# Patient Record
Sex: Female | Born: 1990 | Smoking: Never smoker
Health system: Southern US, Community
[De-identification: ages and names within clinical notes are randomized; demographics above are authoritative.]

## PROBLEM LIST (undated history)

## (undated) DIAGNOSIS — H539 Unspecified visual disturbance: Secondary | ICD-10-CM

## (undated) HISTORY — DX: Unspecified visual disturbance: H53.9

## (undated) HISTORY — PX: ANTERIOR CRUCIATE LIGAMENT REPAIR: SHX115

---

## 2011-05-25 ENCOUNTER — Emergency Department (HOSPITAL_COMMUNITY)
Admission: EM | Admit: 2011-05-25 | Discharge: 2011-05-25 | Discharge status: Against Medical Advice | Payer: Self-pay | Attending: Emergency Medicine | Admitting: Emergency Medicine

## 2011-05-25 ENCOUNTER — Emergency Department (HOSPITAL_COMMUNITY)
Admission: EM | Admit: 2011-05-25 | Discharge: 2011-05-26 | Disposition: A | Discharge status: Home / Self Care | Payer: BC Managed Care – PPO | Attending: Emergency Medicine | Admitting: Emergency Medicine

## 2011-05-25 DIAGNOSIS — X58XXXA Exposure to other specified factors, initial encounter: Secondary | ICD-10-CM | POA: Insufficient documentation

## 2011-05-25 DIAGNOSIS — L509 Urticaria, unspecified: Secondary | ICD-10-CM | POA: Insufficient documentation

## 2011-05-25 DIAGNOSIS — T7840XA Allergy, unspecified, initial encounter: Secondary | ICD-10-CM | POA: Insufficient documentation

## 2011-05-25 DIAGNOSIS — R21 Rash and other nonspecific skin eruption: Secondary | ICD-10-CM | POA: Insufficient documentation

## 2011-05-25 NOTE — ED Notes (Signed)
3 days of hives  Started 3 days before that with strep antibiotics.  Hives on legs, arms, trunk, itching no resp distress

## 2011-05-26 ENCOUNTER — Encounter (HOSPITAL_COMMUNITY): Payer: Self-pay | Admitting: *Deleted

## 2011-05-26 MED ORDER — PREDNISONE 20 MG PO TABS
60.0000 mg | ORAL_TABLET | Freq: Once | ORAL | Status: AC
  Administered 2011-05-26: 60 mg via ORAL
  Filled 2011-05-26: qty 3

## 2011-05-26 MED ORDER — FAMOTIDINE 20 MG PO TABS
20.0000 mg | ORAL_TABLET | Freq: Once | ORAL | Status: AC
  Administered 2011-05-26: 20 mg via ORAL
  Filled 2011-05-26: qty 1

## 2011-05-26 MED ORDER — FAMOTIDINE 20 MG PO TABS: ORAL_TABLET | ORAL | Status: DC

## 2011-05-26 MED ORDER — DIPHENHYDRAMINE HCL 25 MG PO CAPS: 25.0000 mg | ORAL_CAPSULE | Freq: Four times a day (QID) | ORAL | Status: DC | PRN

## 2011-05-26 MED ORDER — PREDNISONE 20 MG PO TABS: 60.0000 mg | ORAL_TABLET | Freq: Every day | ORAL | Status: DC

## 2011-05-26 NOTE — ED Notes (Signed)
50mg  Benadryl given by EMS.  Started with Zyrtec and hydrocortisone cream today

## 2011-05-26 NOTE — Discharge Instructions (Signed)
STOP TAKING YOUR ANTIBIOTIC!  Write the name of the antibiotic down, and let other doctors know you developed a rash while taking this medication.   Take medications as prescribed.  Return to the ER for shortness of breath, swelling of lips or tongue, or other concerning symptoms. If you are not improving, please contact your doctor or the allergy center for further workup.    STEROID ORAL  STEROID ORAL: You have been given a prescription for oral steroids.      This medication is used to reduce inflammation.     Prednisone and other steroids can cause stomach irritation and ulcers in some individuals.  If you are prone to ulcers and gastritis, tell your physician before starting this medication.     Always take this medication as directed.     Keep this medication out of the reach of children.  Always keep this medication in child-proof containers.  DO NOT give your medication to anyone else. CAUTION:  DO NOT stop taking medication abruptly, especially if  you are given a tapering dose schedule.  Stopping this medication abruptly can cause serious complications!  THESE INSTRUCTIONS ARE NOT COMPREHENSIVE (complete):  Ask your pharmacist for additional information and precautions for this medication.   GI ANTIULCER H2  GI ANTIULCER H2: You have been given a medication to help relieve symptoms caused by excessive acid in your stomach or as an adjunct in the treatment of allergic reactions.      DO NOT take this medication if you are pregnant or are nursing unless specifically directed to do so by your physician.     Be sure to tell your regular doctor that you are taking this medication.     Take this medication as directed.     Keep this medication out of the reach of children.  Always keep this medication in child-proof containers.  DO NOT give your medication to anyone else. THESE INSTRUCTIONS ARE NOT COMPREHENSIVE (complete):  Ask your pharmacist for additional information and  precautions for this medication.   ANTIHISTAMINE SEDATING  ANTIHISTAMINE SEDATING: You have been given a prescription for an antihistamine.      This medication is used to treat many forms of allergies, allergic rhinitis, and allergic reactions.  Antihistamines are also used to treat itching associated with many causes.     This medication is considered a sedating antihistamine.  This means that it can make you feel sleepy.  Antihistamines are often the active ingredient used in over-the-counter sleep medications.     DO NOT drive a car, operate machinery, or perform jobs that require you to be alert until you know how you are going to react to this medicine.     Keep this medication out of the reach of children.  Always keep this medication in child-proof containers.  DO NOT give your medication to anyone else.     If you have side-effects that you think are caused by this medicine, tell your doctor.  Common side-effects that can be expected are usually mild and include increased heart rate, shakiness or tremor, mild headache, and nausea.     DO NOT take this medication if you are allergic to it.     DO NOT drink alcoholic beverages while taking this medicine.     If you are pregnant or breast feeding, notify your doctor before taking this medication. THESE INSTRUCTIONS ARE NOT COMPREHENSIVE (complete):  Ask your pharmacist for additional information and precautions for this medication.  URTICARIA - NO EPI-PEN  URTICARIA: You have been diagnosed with hives. The medical term for hives is urticaria.  Hives are raised, red areas on the skin. They usually appear suddenly and come and go quickly on different areas of the skin. Sometimes they happen from an allergic reaction, but most of the time there is no cause that we can find.  Some causes of hives include sunlight, viral infections, heat or cold, bee stings, food (especially eggs, fish, nuts, peanuts and milk), detergents,  perfumes, plants, animals and medications. If you do not already know the cause of your hives, make a list at home of anything that may be new in your life. If you can identify a cause, be sure to avoid it from now on.  To help with the itching take oral (by mouth) diphenhydramine (Benadryl) which is available at any pharmacy over the counter. Follow the package instructions.  If the hives keep coming back and there doesn't seem to be a known cause, talk to your doctor about allergy testing.  YOU SHOULD SEEK MEDICAL ATTENTION IMMEDIATELY, EITHER HERE OR AT THE NEAREST EMERGENCY DEPARTMENT, IF ANY OF THE FOLLOWING OCCURS:      Significant worsening of the rash.     Difficulty breathing (fast shallow breaths, wheezing, blueness around the lips or fingernails) or difficulty swallowing. Call 911 if this problem develops!  If you develop symptoms of Shortness of Breath, Chest Pain, Swelling of lips, mouth or tongue or if your condition becomes worse with any new symptoms, see your doctor or return to the Emergency Department for immediate care. Emergency services are not intended to be a substitute for comprehensive medical attention.  Please contact your doctor for follow up if not improving as expected.   Call your doctor in 5-7 days or as directed if there is no improvement.   Community Resources: *IF YOU ARE IN IMMEDIATE DANGER CALL 911!  Abuse/Neglect:  Family Services Crisis Hotline [facility]): 2230840619 [facility]): ([phone]  After hours, holidays and weekends: 858-147-4310 National Domestic Violence Hotline: (564)780-5954  Mental Health: [facility] Mental [facility]: [facility]cie Ip: [phone]  Health Clinics:  [facility]): [phone] Monday - Friday 8 AM - 9 PM, Saturday and Sunday 10 AM - 9 PM  [facility] [place]: [phone] Monday - Friday 8 AM - 5 PM  [facility]    [address]: [phone] Monday- Friday 8:30 AM - 5:30 PM, Sat 9 AM - 1 PM  [facility] [address]: [phone] [facility]: [phone] [facility] [address]: [phone]  [facility]: [address] [phone]  Cultures: If culture results are positive, we will notify you if a change in treatment is necessary.  LABORATORY TESTS:         If you had any labs drawn in the ED that have not resulted by the time you are discharged home, we will review these lab results and the treatment given to you.  If there is any further treatment or notification needed, we will contact you by phone, or letter.  "PLEASE ENSURE THAT YOU HAVE GIVEN [place] YOUR CURRENT WORKING PHONE NUMBER AND YOUR CURRENT ADDRESS, so that we can contact you if needed."  RADIOLOGY TESTS:  If the referred physician wants today's x-rays, please call the hospital's Radiology Department the day before your doctor's  appointment. [facility]     [phone]   [phone]     [phone]  Our doctors and staff appreciate your choosing [place] for your emergency medical care needs. We are here to serve you  Drug Rash Skin reactions can be caused by several different drugs. Allergy to the medicine can cause itching, hives, and other rashes. Sun exposure causes a red rash with some medicines. Mononucleosis virus can cause a similar red rash when you are taking antibiotics. Sometimes, the rash may be accompanied by pain. The drug rash may happen with new drugs or with medicines that you have been taking for a while. The rash cannot be spread from person to person. In most cases, the symptoms of a drug rash are gone within a few days of stopping the medicine. Your rash, including hives (urticaria), is most likely from the following medicines:  Antibiotics or antimicrobials.   Anticonvulsants or seizure medicines.   Antihypertensives or blood  pressure medicines.   Antimalarials.   Antidepressants or depression medicines.   Antianxiety drugs.   Diuretics or water pills.   Nonsteroidal anti-inflammatory drugs.   Simvastatin.   Lithium.   Omeprazole.   Allopurinol.   Pseudoephedrine.   Amiodarone.   Packed red blood cells, when you get a blood transfusion.   Contrast media, such as when getting an imaging test (CT or CAT scan).  This drug list is not all inclusive, but drug rashes have been reported with all the medicines listed above.Your caregiver will tell you which medicines to avoid. If you react to a medicine, a similar or worse reaction can occur the next time you take it. If you need to stop taking an antibiotic because of a drug rash, an alternative antibiotic may be needed to get rid of your infection. Antihistamine or cortisone drugs may be prescribed to help relieve your symptoms. Stay out of the sun until the rash is completely gone.  Be sure to let your caregiver know about your drug reaction. Do not take this medicine in the future. Call your caregiver if your drug rash does not improve within 3 to 4 days. SEEK IMMEDIATE MEDICAL CARE IF:   You develop breathing problems, swelling in the throat, or wheezing.   You have weakness, fainting, fever, and muscle or joint pains.   You develop blisters or peeling of skin, especially around the mouth.  Document Released: 03/16/2004 Document Revised: 01/26/2011 Document Reviewed: 12/26/2007 [facility] Patient Information 2012 [place], [place].  Drug Allergy A drug allergy means you have a strange reaction to a medicine. You may have puffiness (swelling), itching, red rashes, and hives. Some allergic reactions can be life-threatening. HOME CARE  If you do not know what caused your reaction:  Write down medicines you use.   Write down any problems you have after using medicine.   Avoid things that cause a reaction.   You can see an allergy doctor to be  tested for allergies.  If you have hives or a rash:  Take medicine as told by your doctor.   Place cold cloths on your skin.   Do not take hot baths or hot showers. Take baths in cool water.  If you are severely allergic:  Wear a medical bracelet or necklace that lists your allergy.   Carry your allergy kit or medicine shot to treat severe allergic reactions with you. These can save your life.   Do not drive until medicine from your shot has worn off,  unless your doctor says it is okay.  GET HELP RIGHT AWAY IF:   Your mouth is puffy, or you have trouble breathing.   You have a tight feeling in your chest or throat.   You have hives, puffiness, or itching all over your body.   You throw up (vomit) or have watery poop (diarrhea).   You feel dizzy or pass out (faint).   You think you are having a reaction. Problems often start within 30 minutes after taking a medicine.   You are getting worse, not better.   You have new problems.   Your problems go away and then come back.  This is an emergency. Use your medicine shot or allergy kit as told. Call yourlocal emergency services (911 in [place] after the shot. Even if you feel better after the shot, you need to go to the hospital. You may need more medicine to control a severe reaction. MAKE SURE YOU:  Understand these instructions.   Will watch your condition.   Will get help right away if you are not doing well or get worse.  Document Released: 03/16/2004 Document Revised: 01/26/2011 Document Reviewed: 08/04/2010 [facility] Patient Information 2012 [place], [place].

## 2011-05-26 NOTE — ED Provider Notes (Signed)
History     CSN: [id number]  Arrival date & time 05/25/11  2337   First MD Initiated Contact with Patient 05/26/11 0001      Chief Complaint  Patient presents with  . Urticaria    (Consider location/radiation/quality/duration/timing/severity/associated sxs/prior treatment) HPI 21 year old female presents emergency Department with diffuse itchy rash. Patient reports onset of rash on Monday initially just on her left wrist, but is now spread over her whole body. [facility] Zyrtec and hydrocortisone cream. Of note patient has been on some unknown antibiotic for the last 9 days for a strep throat infection. She does not know the name of this antibiotic. She is unsure she has ever had in her mouth before for illnesses she denies prior drug rashes. No shortness of breath no swelling of lips or tongue no wheezing History reviewed. No pertinent past medical history.  History reviewed. No pertinent past surgical history.  No family history on file.  History  Substance Use Topics  . Smoking status: Never Smoker   . Smokeless tobacco: Not on file  . Alcohol Use: No    OB History    Grav Para Term Preterm Abortions TAB SAB Ect Mult Living                  Review of Systems  All other systems reviewed and are negative.    Allergies  Review of patient's allergies indicates no known allergies.  Home Medications   Current Outpatient Rx  Name Route Sig Dispense Refill  . CETIRIZINE HCL 10 MG PO TABS Oral Take 10 mg by mouth daily.    Marland Kitchen HYDROCORTISONE 1 % EX CREA Topical Apply 1 application topically 2 (two) times daily.    Marland Kitchen PRESCRIPTION MEDICATION Oral Take 1 tablet by mouth 2 (two) times daily. antibiotics    . DIPHENHYDRAMINE HCL 25 MG PO CAPS Oral Take 1 capsule (25 mg total) by mouth every 6 (six) hours as needed for itching. 30 capsule 0  . DIPHENHYDRAMINE HCL 50 MG PO CAPS Oral Take 50 mg by mouth once.    Marland Kitchen FAMOTIDINE 20 MG  PO TABS  Take one tab po bid x 5 days 10 tablet 0  . PREDNISONE 20 MG PO TABS Oral Take 3 tablets (60 mg total) by mouth daily. 15 tablet 0    BP 150/78  Pulse 79  Temp(Src) 98.5 F (36.9 C) (Oral)  Resp 20  SpO2 100%  Physical Exam  Constitutional: She appears well-developed and well-nourished.  HENT:  Head: Normocephalic and atraumatic.  Eyes: Conjunctivae and EOM are normal. Pupils are equal, round, and reactive to light.  Neck: Normal range of motion. Neck supple. No tracheal deviation present. No thyromegaly present.  Cardiovascular: Normal rate, regular rhythm, normal heart sounds and intact distal pulses.  Exam reveals no gallop and no friction rub.   No murmur heard. Pulmonary/Chest: Effort normal and breath sounds normal. No stridor. No respiratory distress. She has no wheezes. She has no rales. She exhibits no tenderness.  Abdominal: Soft. Bowel sounds are normal.  Musculoskeletal: Normal range of motion. She exhibits no edema and no tenderness.  Lymphadenopathy:    She has no cervical adenopathy.  Skin:       Diffuse maculopapular rash over abdomen and upper thighs and bilateral arms secondary excoriation noted    ED Course  Procedures (including critical care time)  Labs Reviewed - No data to display No results found.  1. Rash and nonspecific skin eruption   2. Allergic reaction       MDM  21 year old female with diffuse rash in context of recent antibiotic use. Concerned that this might be a allergic reaction to penicillin or other similar antibiotic. Patient advised to stop taking the antibiotic and we'll start her on Benadryl Pepcid and steroids        Olivia Mackie, MD 05/26/11 [phone]

## 2013-03-20 ENCOUNTER — Emergency Department (HOSPITAL_COMMUNITY)
Admission: EM | Admit: 2013-03-20 | Discharge: 2013-03-20 | Disposition: A | Discharge status: Home / Self Care | Payer: BC Managed Care – PPO | Source: Home / Self Care

## 2014-12-30 ENCOUNTER — Encounter (HOSPITAL_COMMUNITY): Payer: Self-pay | Admitting: Family Medicine

## 2014-12-30 ENCOUNTER — Emergency Department (HOSPITAL_COMMUNITY): Payer: BLUE CROSS/BLUE SHIELD

## 2014-12-30 ENCOUNTER — Emergency Department (HOSPITAL_COMMUNITY)
Admission: EM | Admit: 2014-12-30 | Discharge: 2014-12-30 | Disposition: A | Discharge status: Home / Self Care | Payer: BLUE CROSS/BLUE SHIELD | Attending: Emergency Medicine | Admitting: Emergency Medicine

## 2014-12-30 DIAGNOSIS — H539 Unspecified visual disturbance: Secondary | ICD-10-CM

## 2014-12-30 DIAGNOSIS — H469 Unspecified optic neuritis: Secondary | ICD-10-CM | POA: Diagnosis not present

## 2014-12-30 DIAGNOSIS — H4710 Unspecified papilledema: Secondary | ICD-10-CM

## 2014-12-30 DIAGNOSIS — Z3202 Encounter for pregnancy test, result negative: Secondary | ICD-10-CM | POA: Diagnosis not present

## 2014-12-30 DIAGNOSIS — H538 Other visual disturbances: Secondary | ICD-10-CM | POA: Diagnosis present

## 2014-12-30 LAB — CBC WITH DIFFERENTIAL/PLATELET
Basophils Absolute: 0 10*3/uL (ref 0.0–0.1)
Basophils Relative: 1 %
EOS ABS: 0.1 10*3/uL (ref 0.0–0.7)
EOS PCT: 3 %
HCT: 34.4 % — ABNORMAL LOW (ref 36.0–46.0)
HEMOGLOBIN: 12.3 g/dL (ref 12.0–15.0)
LYMPHS ABS: 1.8 10*3/uL (ref 0.7–4.0)
LYMPHS PCT: 52 %
MCH: 31 pg (ref 26.0–34.0)
MCHC: 35.8 g/dL (ref 30.0–36.0)
MCV: 86.6 fL (ref 78.0–100.0)
MONOS PCT: 7 %
Monocytes Absolute: 0.3 10*3/uL (ref 0.1–1.0)
NEUTROS PCT: 37 %
Neutro Abs: 1.3 10*3/uL — ABNORMAL LOW (ref 1.7–7.7)
Platelets: 206 10*3/uL (ref 150–400)
RBC: 3.97 MIL/uL (ref 3.87–5.11)
RDW: 12.9 % (ref 11.5–15.5)
WBC: 3.5 10*3/uL — ABNORMAL LOW (ref 4.0–10.5)

## 2014-12-30 LAB — I-STAT BETA HCG BLOOD, ED (MC, WL, AP ONLY)

## 2014-12-30 LAB — BASIC METABOLIC PANEL
Anion gap: 6 (ref 5–15)
BUN: 8 mg/dL (ref 6–20)
CHLORIDE: 105 mmol/L (ref 101–111)
CO2: 27 mmol/L (ref 22–32)
CREATININE: 0.78 mg/dL (ref 0.44–1.00)
Calcium: 9.2 mg/dL (ref 8.9–10.3)
GFR calc Af Amer: >60 mL/min (ref >60)
GFR calc non Af Amer: >60 mL/min (ref >60)
GLUCOSE: 89 mg/dL (ref 65–99)
Potassium: 3.7 mmol/L (ref 3.5–5.1)
SODIUM: 138 mmol/L (ref 135–145)

## 2014-12-30 MED ORDER — PREDNISONE 10 MG PO TABS: ORAL_TABLET | ORAL | Status: DC

## 2014-12-30 MED ORDER — IBUPROFEN 800 MG PO TABS
800.0000 mg | ORAL_TABLET | Freq: Once | ORAL | Status: AC
  Administered 2014-12-30: 800 mg via ORAL
  Filled 2014-12-30: qty 1

## 2014-12-30 MED ORDER — GADOBENATE DIMEGLUMINE 529 MG/ML IV SOLN
20.0000 mL | Freq: Once | INTRAVENOUS | Status: AC | PRN
  Administered 2014-12-30: 20 mL via INTRAVENOUS

## 2014-12-30 MED ORDER — SODIUM CHLORIDE 0.9 % IV SOLN
1000.0000 mg | Freq: Once | INTRAVENOUS | Status: AC
  Administered 2014-12-30: 1000 mg via INTRAVENOUS
  Filled 2014-12-30 (×2): qty 8

## 2014-12-30 NOTE — ED Provider Notes (Signed)
  Face-to-face evaluation   History: Patient here for evaluation of left eye vision loss, which has been present, for greater than 1 week. She has a mild headache today but feels like it is because she has not eaten yet. Vision loss is mild. No other associated symptoms. She saw an ophthalmologist today who sent her here for further evaluation with MR imaging. 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 She also has visual field loss, by report. See attached documentation, from Dr. Sherryll BurgerShah.  Physical exam: Alert, calm, cooperative. Pupils equal, round, reactive to light bilaterally. Extraocular muscles are intact. No dysarthria or nystagmus.  16:30- . I discussed the case with on-call neurology, Dr. Thad Rangereynolds; she recommends doing an LP if the MR is negative, to evaluate for occult MS. She would send fluid for IgG index, myelin basic protein, and oligoclonal bands, if the LP is needed. Patient does not have clinical syndrome consistent with meningitis.  Medical screening examination/treatment/procedure(s) were conducted as a shared visit with non-physician practitioner(s) and myself.  I personally evaluated the patient during the encounter  Mancel BaleElliott Norvella Loscalzo, MD 12/31/14 [phone]

## 2014-12-30 NOTE — ED Notes (Signed)
Pt here for vision changes. Sent her by opthalmologic with assymetrical visual field changes, and disc edema of the left eye. They want to r/o optic neuritis or a compressive optic neuropathy

## 2014-12-30 NOTE — ED Notes (Signed)
Phlebotomy at bedside.

## 2014-12-30 NOTE — ED Notes (Signed)
Patient transported to MRI 

## 2014-12-30 NOTE — Discharge Instructions (Signed)
Visual Disturbances You have had a disturbance in your vision. This may be caused by various conditions, such as:  Migraines. Migraine headaches are often preceded by a disturbance in vision. Blind spots or light flashes are followed by a headache. This type of visual disturbance is temporary. It does not damage the eye.  Glaucoma. This is caused by increased pressure in the eye. Symptoms include haziness, blurred vision, or seeing rainbow colored circles when looking at bright lights. Partial or complete visual loss can occur. You may or may not experience eye pain. Visual loss may be gradual or sudden and is irreversible. Glaucoma is the leading cause of blindness.  Retina problems. Vision will be reduced if the retina becomes detached or if there is a circulation problem as with diabetes, high blood pressure, or a mini-stroke. Symptoms include seeing "floaters," flashes of light, or shadows, as if a curtain has fallen over your eye.  Optic nerve problems. The main nerve in your eye can be damaged by redness, soreness, and swelling (inflammation), poor circulation, drugs, and toxins. It is very important to have a complete exam done by a specialist to determine the exact cause of your eye problem. The specialist may recommend medicines or surgery, depending on the cause of the problem. This can help prevent further loss of vision or reduce the risk of having a stroke. Contact the caregiver to whom you have been referred and arrange for follow-up care right away. SEEK IMMEDIATE MEDICAL CARE IF:   Your vision gets worse.  You develop severe headaches.  You have any weakness or numbness in the face, arms, or legs.  You have any trouble speaking or walking.   This information is not intended to replace advice given to you by your health care provider. Make sure you discuss any questions you have with your health care provider.   Document Released: 03/16/2004 Document Revised: 05/01/2011  Document Reviewed: 07/16/2013 Elsevier Interactive Patient Education 2016 [employer].   Emergency Department Resource Guide 1) Find a Doctor and Pay Out of Pocket Although you won't have to find out who is covered by your insurance plan, it is a good idea to ask around and get recommendations. You will then need to call the office and see if the doctor you have chosen will accept you as a new patient and what types of options they offer for patients who are self-pay. Some doctors offer discounts or will set up payment plans for their patients who do not have insurance, but you will need to ask so you aren't surprised when you get to your appointment.  2) Contact Your Local Health Department Not all health departments have doctors that can see patients for sick visits, but many do, so it is worth a call to see if yours does. If you don't know where your local health department is, you can check in your phone book. The CDC also has a tool to help you locate your state's health department, and many state websites also have listings of all of their local health departments.  3) Find a Walk-in Clinic If your illness is not likely to be very severe or complicated, you may want to try a walk in clinic. These are popping up all over the country in pharmacies, drugstores, and shopping centers. They're usually staffed by nurse practitioners or physician assistants that have been trained to treat common illnesses and complaints. They're usually fairly quick and inexpensive. However, if you have serious medical issues or chronic  medical problems, these are probably not your best option.  No Primary Care Doctor: - Call Health Connect at  [phone] - they can help you locate a primary care doctor that  accepts your insurance, provides certain services, etc. - Physician Referral Service- [phone]  Chronic Pain Problems: Organization         Address  Phone   Notes  [facility]  [phone] Patients need to be referred by their primary care doctor.   Medication Assistance: Organization         Address  Phone   Notes  [facility] Medication [facility] [address] [place] [place], [place] [address] [phone] --Must be a resident of [facility] -- Must have NO insurance coverage whatsoever (no Medicaid/ Medicare, etc.) -- The pt. MUST have a primary care doctor that directs their care regularly and follows them in the community   [facility]ist  [phone]   [employer]  [phone]    Agencies that provide inexpensive medical care: Organization         Address  Phone   Notes  [facility]  [phone]   [facility]    [phone]   [facility] [address] [place], [place] [address] [phone]   [facility] [address] [place], [place] [phone]   Planned Parenthood    [phone]   [facility]    [phone]   [facility]  [address], [place] Phone:  [phone], Fax:  [phone] Hours of Operation:  9 am - 6 pm, M-F.  Also accepts Medicaid/Medicare and self-pay.  [facility]  [address], [place] Phone: [phone], Fax: [phone]. Hours of Operation:  8:30 am - 5:30 pm, M-F.  Also accepts Medicaid and self-pay.  [facility] [place] [address] [place] Phone: [phone]   [facility] [address] Natasha Bence [place], [place] [phone], Ext. [phone] Mondays & Thursdays: 7-9 AM.  First 15 patients are seen on a first come, first serve basis.    Medicaid-accepting [facility] Providers:  Organization         Address  Phone   Notes  [facility] [address], [place] [phone] Also accepts self-pay patients.  [facility] [address] Laurell Josephs [place], [place]   [phone]   [facility] [address], [place] [phone]   [facility] [address], [place] [phone]   Renaye Rakers [address], [place]   [phone] Only accepts [place] patients after they have their name applied to their card.   Self-Pay (no insurance) in [facility]:  Organization         Address  Phone   Notes  Sickle Cell Patients, [facility] [address] [place], [place] [phone]   [facility] [address] [place], [place] [phone]   [facility] [place]  [address], [place] [phone]   [facility]/Dr. Osei-Bonsu  [address] [place] or [address], [place] [phone] Phone number for both [place] and [place] locations is the same.  Urgent  Medical and [facility] [address], [place] [phone]   [facility] [address] [place] or [address] [phone]   [facility] [address] [place] [phone], phone; [phone], fax Sees patients 1st and 3rd Saturday of every month.  Must not qualify for public or private insurance (i.e. Medicaid, Medicare, [place] Health Choice, Veterans' Benefits)  Household income should be no more than 200% of the poverty level The clinic cannot treat you if you are pregnant or think you are pregnant  Sexually transmitted diseases are not treated at the clinic.    Dental Care: Organization         Address  Phone  Notes  [facility] Department of [facility] [address] [place], [place] [phone] Accepts children up to age 24 who are enrolled in [place] or [place] Health Choice; pregnant women with a Medicaid card; and children who have applied for Medicaid or [place] Health Choice, but  were declined, whose parents can pay a reduced fee at time of service.  [facility] Department of [facility]  [address], [place] [phone] Accepts children up to age 41 who are enrolled in [place] or [place] Health Choice; pregnant women with a Medicaid card; and children who have applied for Medicaid or [place] Health Choice, but were declined, whose parents can pay a reduced fee at time of service.  [facility]  [address] [place], [place] [phone] Patients are seen by appointment only. Walk-ins are not accepted. [facility] will see patients 74 years of age and older. Monday - Tuesday (8am-5pm) Most Wednesdays (8:30-5pm) $30 per visit, cash only  [facility] Adult Dental Access [facility]  [address], [facility] [phone] Patients are seen by appointment only. Walk-ins are not accepted. [facility] will see patients 21 years of age and older. One Wednesday Evening (Monthly: Volunteer Based).  $30 per visit, cash only  [employer]  [phone] for adults; Children under age 37, call Graduate Pediatric Dentistry at [phone]. Children aged 64-14, please call [phone] to request a pediatric application.  Dental services are provided in all areas of dental care including fillings, crowns and bridges, complete and partial dentures, implants, gum treatment, root canals, and extractions. Preventive care is also provided. Treatment is provided to both adults and children. Patients are selected via a lottery and there is often a waiting list.   [facility] [address] [place]  [phone] [email or web address]   [facility] [address] [place], [place] [phone], Ext. [phone] Second and Fourth Thursday of each month, opens at 6:30 AM; Clinic ends at 9 AM.  Patients are seen on a first-come first-served basis, and a limited number are seen during each clinic.    [facility]  [address] Ether Griffins [place], [place] [phone]   Eligibility Requirements You must have lived in [place], [place], or [place] counties for at least the last three months.   You cannot be eligible for state or federal sponsored [place], including [employer], [place], or [employer].   You generally cannot be eligible for healthcare insurance through your employer.    How to apply: Eligibility screenings are held every Tuesday and Wednesday afternoon from 1:00 pm until 4:00 pm. You do not need an appointment for the interview!  [facility] [address], [place], [place] [phone]   [facility]  [phone]   [facility]  [phone]   [facility]  [phone]    Behavioral Health Resources in the Community: Intensive Outpatient Programs Organization         Address  Phone  Notes  [facility] [address], [place], [place] [phone]   [facility] [address], [place], [place] [phone]   [facility] [address], [place], [place]  [phone]   [facility] [address],  [place], [place] [phone] or [phone]   Substance Abuse Resources Organization         Address  Phone  Notes  [facility]  [phone]   [facility]  [phone]   [facility]  [phone]   [facility]  [phone]   Residential & Outpatient Substance Abuse Program  [phone]   Psychological Services Organization         Address  Phone  Notes  [facility]  [phone]   [facility]  [phone]- [phone]   [facility] [address], [place] [phone] or [phone]    Mobile Crisis Teams Organization         Address  Phone  Notes  [facility], Mobile Crisis Care  Unit  [phone]   [facility]  [address] [place], [place] [phone]   Doristine LocksSharon DeEsch [address] [place] [phone]    Self-Help/Support Groups Organization         Address  Phone             Notes  [facility] [place] - variety of support groups  [phone] Call for more information  Narcotics Anonymous (NA), [facility]aring [facility] [address], [place]  2 meetings at this location   Statisticianesidential Treatment Programs Organization         Address  Phone  Notes  [facility] [address],    [place]  [phone]   [facility]  [address], [place] [phone], [place], [place] [phone]   [facility] [address], [place] [phone] Admissions: 8am-3pm M-F  [facility] [address]    [place], [place] [phone]   [facility] [address], [place], [place] [phone]   [facility] [address]  [place], [place] [phone]   [facility] - Intensive Outpatient [address], [place], [place] [phone]   [facility] ([facility] [address]  [place], [place] [phone] or [phone]   [facility] ([facility]) [address] [place], [place] [phone] Accepts Medicaid  [facility] [address]  [place] [phone] Substance Abuse/Addiction Treatment   [facility] Resources Organization         Address  Phone  Notes  [facility]  [phone]   Angie FavaJulie Brannon, PhD [address], [address] [place], [place]   [phone] or [phone]   [facility]   [address] [place], [place] [phone]   [facility] [address], [place], [place] [phone] Insurance/Medicaid/sponsorship through [employer] [facility]  [facility] Families [address]  [place], [place] [phone] [place], [place] [phone]    Dr. Adele Schilder  [phone]   [facility] Dept. 1) [address] [place] 2) [place] 3)  [address], [place] [phone]  [phone]   [place] [phone] or [phone] (After Hours)

## 2014-12-30 NOTE — ED Provider Notes (Signed)
Received results of MRI, inflammation evident that is consistent with optic neuritis. Discussed with referring ophthalmologist Dr. Sherryll BurgerShah who recommended 1 g of IV Solu-Medrol, adding Lyme, syphilis and ACE testing to evaluate for alternative etiologies. Also recommended discussing case with neurology to determine need for further workup for MS.  Discussed with on-call neurologist Dr Roseanne RenoStewart who did not recommend further testing with LP for MS as there were not multiple lesions identified on the MRI. He recommends 60 mg of prednisone daily for 5 days then taper following IV Solu-Medrol here as well as outpatient follow-up with neurology and ophthalmology. No indication for IP management currently.  Lyndal Pulleyaniel Kinnedy Mongiello, MD 12/30/14 [phone]

## 2014-12-30 NOTE — ED Provider Notes (Signed)
CSN: [id number]     Arrival date & time 12/30/14  1140 History   First MD Initiated Contact with Patient 12/30/14 1544     Chief Complaint  Patient presents with  . Eye Problem     (Consider location/radiation/quality/duration/timing/severity/associated sxs/prior Treatment) HPI   Teresa Stokes is a 24 y.o. female, pt with no past medical history, presenting to the ED with blurred vision in her left eye that began on 10/27 and then returned to normal after two days. Pt states that today it seems like "there is a dark film over my eye." Pt was seen today at [facility] by Dr. Christiana Fuchs. Pt was also seen by another ophthalmologist on Nov 1 named Dr. Francesca Oman. Pt states she has pain when she looks medially as well as behind her left eye. Pt describes pain as a throbbing, 10/10, non-radiating. Pt has not tried anything for the pain.     History reviewed. No pertinent past medical history. History reviewed. No pertinent past surgical history. History reviewed. No pertinent family history. Social History  Substance Use Topics  . Smoking status: Never Smoker   . Smokeless tobacco: None  . Alcohol Use: No   OB History    No data available     Review of Systems  Eyes: Positive for pain and visual disturbance.  All other systems reviewed and are negative.     Allergies  Review of patient's allergies indicates no known allergies.  Home Medications   Prior to Admission medications   Medication Sig Start Date End Date Taking? Authorizing Provider  ibuprofen (ADVIL,MOTRIN) 200 MG tablet Take 400 mg by mouth every 6 (six) hours as needed for headache.   Yes Historical Provider, MD  predniSONE (DELTASONE) 10 MG tablet Take 6 tablets daily for 5 days, followed by 4 tablets for 2 days, followed by 3 tablets for 2 days, followed by 2 tablets for 2 days, followed by 1 tablet for 2 days for a total of 50 tablets over 13 days 12/30/14   Lyndal Pulley, MD   BP 142/80 mmHg  Pulse 62   Temp(Src) 98.1 F (36.7 C) (Oral)  Resp 22  Ht  (1.905 m)  Wt 248 lb (112.492 kg)  BMI 31.00 kg/m2  SpO2 100% Physical Exam  Constitutional: She appears well-developed and well-nourished. No distress.  HENT:  Head: Normocephalic and atraumatic.  Right Ear: External ear normal.  Left Ear: External ear normal.  Nose: Nose normal.  Mouth/Throat: Oropharynx is clear and moist.  Eyes: Conjunctivae, EOM and lids are normal. Pupils are equal, round, and reactive to light. Right eye exhibits no discharge. Left eye exhibits no discharge.  Unable to accurately visualize the optic disc through the undilated eye.   Neck: Normal range of motion. Neck supple.  Cardiovascular: Normal rate and regular rhythm.   Pulmonary/Chest: Effort normal. No respiratory distress.  Musculoskeletal: She exhibits no edema or tenderness.  Lymphadenopathy:    She has no cervical adenopathy.  Neurological: She is alert.  Skin: Skin is warm and dry. She is not diaphoretic.  Nursing note and vitals reviewed.   ED Course  Procedures (including critical care time) Labs Review Labs Reviewed  CBC WITH DIFFERENTIAL/PLATELET - Abnormal; Notable for the following:    WBC 3.5 (*)    HCT 34.4 (*)    Neutro Abs 1.3 (*)    All other components within normal limits  BASIC METABOLIC PANEL  LYME DISEASE DNA BY PCR(BORRELIA BURG)  ANGIOTENSIN CONVERTING ENZYME  RPR  I-STAT BETA HCG BLOOD, ED ([facility], [facility], [facility] ONLY)    Imaging Review Mr Lodema Pilot Contrast  12/30/2014  CLINICAL DATA:  Asymmetric visual field changes. Left-sided papilledema. EXAM: MRI HEAD AND ORBITS WITHOUT AND WITH CONTRAST TECHNIQUE: Multiplanar, multiecho pulse sequences of the brain and surrounding structures were obtained without and with intravenous contrast. Multiplanar, multiecho pulse sequences of the orbits and surrounding structures were obtained including fat saturation techniques, before and after intravenous contrast administration. CONTRAST:   20mL MULTIHANCE GADOBENATE DIMEGLUMINE 529 MG/ML IV SOLN COMPARISON:  None. FINDINGS: MRI HEAD FINDINGS No acute infarct, hemorrhage, or mass lesion is present. The ventricles are of normal size. No significant extraaxial fluid collection is present. No significant white matter disease is present. Internal auditory canals are within normal limits. Brainstem and cerebellum is normal. Mild mucosal thickening is present in the anterior ethmoid air cells and right frontal sinus. Mild mucosal thickening is present in the maxillary sinuses bilaterally. The remaining paranasal sinuses are clear. The mastoid air cells are clear. MRI ORBITS FINDINGS The globes are within normal limits bilaterally. The discs are located. Edematous changes are present throughout the left optic nerve. Is diffuse enhancement of the left optic nerve from the orbital apex to the optic disc. No discrete mass lesion is present. The surrounding retro-orbital fat is clear. The extraosseous muscles are within normal limits. IMPRESSION: 1. Diffuse enhancement and increased T2 signal within the left optic nerve. This most likely represents optic neuritis. Differential diagnosis would include idiopathic para neuritis. Infectious neuritis is also considered. No definite mass lesion is present. There is no significant proptosis. 2. Normal MRI appearance of the brain. 3. Mild sinus disease. These results were called by telephone at the time of interpretation on 12/30/2014 at 6:48 pm to Dr. Clydene Pugh, who verbally acknowledged these results. Electronically Signed   By: Marin Roberts M.D.   On: 12/30/2014 18:49   Mr Birdie Hopes Wo/w Cm  12/30/2014  CLINICAL DATA:  Asymmetric visual field changes. Left-sided papilledema. EXAM: MRI HEAD AND ORBITS WITHOUT AND WITH CONTRAST TECHNIQUE: Multiplanar, multiecho pulse sequences of the brain and surrounding structures were obtained without and with intravenous contrast. Multiplanar, multiecho pulse sequences of the  orbits and surrounding structures were obtained including fat saturation techniques, before and after intravenous contrast administration. CONTRAST:  20mL MULTIHANCE GADOBENATE DIMEGLUMINE 529 MG/ML IV SOLN COMPARISON:  None. FINDINGS: MRI HEAD FINDINGS No acute infarct, hemorrhage, or mass lesion is present. The ventricles are of normal size. No significant extraaxial fluid collection is present. No significant white matter disease is present. Internal auditory canals are within normal limits. Brainstem and cerebellum is normal. Mild mucosal thickening is present in the anterior ethmoid air cells and right frontal sinus. Mild mucosal thickening is present in the maxillary sinuses bilaterally. The remaining paranasal sinuses are clear. The mastoid air cells are clear. MRI ORBITS FINDINGS The globes are within normal limits bilaterally. The discs are located. Edematous changes are present throughout the left optic nerve. Is diffuse enhancement of the left optic nerve from the orbital apex to the optic disc. No discrete mass lesion is present. The surrounding retro-orbital fat is clear. The extraosseous muscles are within normal limits. IMPRESSION: 1. Diffuse enhancement and increased T2 signal within the left optic nerve. This most likely represents optic neuritis. Differential diagnosis would include idiopathic para neuritis. Infectious neuritis is also considered. No definite mass lesion is present. There is no significant proptosis. 2. Normal MRI appearance of the brain. 3. Mild sinus  disease. These results were called by telephone at the time of interpretation on 12/30/2014 at 6:48 pm to Dr. Clydene PughKNOTT, who verbally acknowledged these results. Electronically Signed   By: Marin Robertshristopher  Mattern M.D.   On: 12/30/2014 18:49   I have personally reviewed and evaluated these images and lab results as part of my medical decision-making.   EKG Interpretation None       Visual Acuity  Right Eye Distance:   Left Eye  Distance:   Bilateral Distance:    Right Eye Near: R Near: 20/30 (No Glasses) Left Eye Near:  L Near: 20/40 (No Glasses) Bilateral Near:  20/25 (No Glasses)  MDM   Final diagnoses:  Visual disturbance of one eye  Optic neuritis, left    Teresa Stokes presents with reduction in vision and instructions to come to the ED for an MRI from her ophthalmologist.   Findings and plan of care discussed with Mancel BaleElliott Wentz, MD and then with Margorie Johnan Knott, MD after EDP shift change.   Plan is to obtain the recommended MRIs, which included MRI of the brain and MRI of the orbit both with and without contrast as well as a fat saturation. It is also recommended that we obtain a LP to check opening pressure if MRI is negative. Should abnormalities be discovered, will consult with Dr. Sherryll BurgerShah via his cell phone [phone]. Dr. Effie ShyWentz discussed the case with on-call neurologist, Dr. Thad Rangereynolds; she recommends doing an LP if the MR is negative, to evaluate for occult and assess. She would send fluid for IgG index, myelin basic protein, and oligoclonal bands, if the LP is needed. Patient does not have clinical syndrome consistent with meningitis. 7:07 PM MRI reveals evidence of optic neuritis. Dr. Clydene PughKnott spoke with Dr. Sherryll BurgerShah who gave instructions for patient's care. Pt will need IV steroids, oral prednisone, [facility], and follow up with Dr. Sherryll BurgerShah. This information and plan of care were passed on to the patient and patient's father at bedside, both parties voice understanding and acceptance of the plan and are comfortable with discharge.  Anselm PancoastShawn C Joy, PA-C 12/30/14 1921  Anselm PancoastShawn C Joy, PA-C 12/30/14 1955  Mancel BaleElliott Wentz, MD 12/31/14 [phone]

## 2014-12-30 NOTE — ED Notes (Signed)
Pt remains in MRI 

## 2014-12-31 LAB — RPR: RPR Ser Ql: NONREACTIVE

## 2014-12-31 LAB — ANGIOTENSIN CONVERTING ENZYME: ANGIOTENSIN-CONVERTING ENZYME: 69 U/L (ref 14–82)

## 2015-01-01 LAB — LYME DISEASE DNA BY PCR(BORRELIA BURG): LYME DISEASE(B. BURGDORFERI) PCR: NEGATIVE

## 2015-01-18 ENCOUNTER — Ambulatory Visit: Payer: BLUE CROSS/BLUE SHIELD | Admitting: Neurology

## 2015-01-19 ENCOUNTER — Other Ambulatory Visit: Payer: Self-pay | Admitting: *Deleted

## 2015-01-19 ENCOUNTER — Encounter: Payer: Self-pay | Admitting: Diagnostic Neuroimaging

## 2015-01-19 ENCOUNTER — Ambulatory Visit (INDEPENDENT_AMBULATORY_CARE_PROVIDER_SITE_OTHER): Payer: BLUE CROSS/BLUE SHIELD | Admitting: Diagnostic Neuroimaging

## 2015-01-19 VITALS — BP 144/82 | HR 66 | Resp 14 | Ht 75.0 in | Wt 268.0 lb

## 2015-01-19 DIAGNOSIS — H469 Unspecified optic neuritis: Secondary | ICD-10-CM | POA: Diagnosis not present

## 2015-01-19 DIAGNOSIS — H538 Other visual disturbances: Secondary | ICD-10-CM | POA: Diagnosis not present

## 2015-01-19 NOTE — Patient Instructions (Signed)
Thank you for coming to see [place] at [facility]. I hope we have been able to provide you high quality care today.  You may receive a patient satisfaction survey over the next few weeks. We would appreciate your feedback and comments so that we may continue to improve ourselves and the health of our patients.  - I will check additional testing (MRI scans and lab testing)   ~~~~~~~~~~~~~~~~~~~~~~~~~~~~~~~~~~~~~~~~~~~~~~~~~~~~~~~~~~~~~~~~~  DR. Lunna Vogelgesang'S GUIDE TO HAPPY AND HEALTHY LIVING These are some of my general health and wellness recommendations. Some of them may apply to you better than others. Please use common sense as you try these suggestions and feel free to ask me any questions.   ACTIVITY/FITNESS Mental, social, emotional and physical stimulation are very important for brain and body health. Try learning a new activity (arts, music, language, sports, games).  Keep moving your body to the best of your abilities. You can do this at home, inside or outside, the park, community center, gym or anywhere you like. Consider a physical therapist or personal trainer to get started. Consider the app Sworkit. Fitness trackers such as smart-watches, smart-phones or Fitbits can help as well.   NUTRITION Eat more plants: colorful vegetables, nuts, seeds and berries.  Eat less sugar, salt, preservatives and processed foods.  Avoid toxins such as cigarettes and alcohol.  Drink water when you are thirsty. Warm water with a slice of lemon is an excellent morning drink to start the day.  Consider these websites for more information The Nutrition Source (https://www.henry-hernandez.biz/) Precision Nutrition (WindowBlog.ch)   RELAXATION Consider practicing mindfulness meditation or other relaxation techniques such as deep breathing, prayer, yoga, tai chi, massage. See website mindful.org or the apps Headspace or Calm to help get  started.   SLEEP Try to get at least 7-8+ hours sleep per day. Regular exercise and reduced caffeine will help you sleep better. Practice good sleep hygeine techniques. See website sleep.org for more information.   PLANNING Prepare estate planning, living will, healthcare POA documents. Sometimes this is best planned with the help of an attorney. Theconversationproject.org and agingwithdignity.org are excellent resources.

## 2015-01-19 NOTE — Progress Notes (Signed)
[facility]  PATIENT: Teresa Stokes DOB: Aug 15, 1990  REFERRING CLINICIAN: Maryelizabeth Kaufmann HISTORY FROM: patient  REASON FOR VISIT: new consult    HISTORICAL  CHIEF COMPLAINT:  Chief Complaint  Patient presents with  . Optic Neuritis    Teresa Stokes is here alone today for r/o MS.  Sts. onset last week of October of pain, blurry vision left eye.  Sts. she saw Dr. Sherryll Burger at [facility]. and then was sent to [facility] ER  where she reports mri showed optic neuritis.  Sts. she was given one dose of  IV steroids, then rx. for steroid dose pk and d/c to f/u with neuro./fim    HISTORY OF PRESENT ILLNESS:   24 year old female here for evaluation of left eye optic neuritis.   12/17/2014 patient had onset of pain, blurriness and double vision in her left eye. Moving her eyes to the side seem to cause pain. Patient went to eye doctor and was diagnosed with possible optic neuritis. She was sent to the emergency room for urgent MRI and additional testing. MRI of the brain and orbits demonstrate enhancing left optic nerve and left optic neuritis was confirmed. No additional brain lesions were found. Patient was treated with 1 dose of IV steroids and then prednisone taper.  Since that time symptoms have improved but are not yet completely normal. Her pain has improved. She still feels some mild blurriness from left eye.  In retrospect no other events of blurred vision or optic neuritis type symptoms. No prior episodes of numbness, weakness, tingling, slurred speech, trouble talking, bowel or bladder incontinence. No family history of multiple sclerosis.   REV IEW OF SYSTEMS: Full 14 system review of systems performed and notable only for blurred vision.   ALLERGIES: No Known Allergies  HOME MEDICATIONS: No outpatient prescriptions prior to visit.   No facility-administered medications prior to visit.    PAST MEDICAL HISTORY: Past Medical History  Diagnosis Date  . Vision  abnormalities     PAST SURGICAL HISTORY: Past Surgical History  Procedure Laterality Date  . Anterior cruciate ligament repair Right     FAMILY HISTORY: Family History  Problem Relation Age of Onset  . Healthy Mother   . Healthy Father   . Healthy Sister     SOCIAL HISTORY:  Social History   Social History  . Marital Status: Unknown    Spouse Name: N/A  . Number of Children: N/A  . Years of Education: N/A   Occupational History  . Not on file.   Social History Main Topics  . Smoking status: Never Smoker   . Smokeless tobacco: Not on file  . Alcohol Use: 0.0 oz/week    0 Standard drinks or equivalent per week     Comment: social/fim  . Drug Use: No  . Sexual Activity: Not on file   Other Topics Concern  . Not on file   Social History Narrative     PHYSICAL EXAM  GENERAL EXAM/CONSTITUTIONAL: Vitals:  Filed Vitals:   01/19/15 1051  BP: 144/82  Pulse: 66  Resp: 14  Height:  (1.905 m)  Weight: 268 lb (121.564 kg)     Body mass index is 33.5 kg/(m^2).  Visual Acuity Screening   Right eye Left eye Both eyes  Without correction: 20/40 20/40   With correction:        Patient is in no distress; well developed, nourished and groomed; neck is supple  CARDIOVASCULAR:  Examination of carotid arteries is  normal; no carotid bruits  Regular rate and rhythm, no murmurs  Examination of peripheral vascular system by observation and palpation is normal  EYES:  Ophthalmoscopic exam of optic discs and posterior segments is normal; no papilledema or hemorrhages  SUBTLE LEFT APD  INCREASED SENSITIVITY TO LIGHT IN LEFT EYE  MUSCULOSKELETAL:  Gait, strength, tone, movements noted in Neurologic exam below  NEUROLOGIC: MENTAL STATUS:  No flowsheet data found.  awake, alert, oriented to person, place and time  recent and remote memory intact  normal attention and concentration  language fluent, comprehension intact, naming intact,   fund of  knowledge appropriate  CRANIAL NERVE:   2nd - no papilledema on fundoscopic exam  2nd, 3rd, 4th, 6th - pupils equal and reactive to light, visual fields full to confrontation, extraocular muscles intact, no nystagmus  5th - facial sensation symmetric  7th - facial strength symmetric  8th - hearing intact  9th - palate elevates symmetrically, uvula midline  11th - shoulder shrug symmetric  12th - tongue protrusion midline  MOTOR:   normal bulk and tone, full strength in the BUE, BLE  SENSORY:   normal and symmetric to light touch, pinprick, temperature, vibration   COORDINATION:   finger-nose-finger, fine finger movements normal  REFLEXES:   deep tendon reflexes present and symmetric  GAIT/STATION:   narrow based gait; able to walk tandem; romberg is negative    DIAGNOSTIC DATA (LABS, IMAGING, TESTING) - I reviewed patient records, labs, notes, testing and imaging myself where available.  No results found for: WBC, HGB, HCT, MCV, PLT No results found for: NA, K, CL, CO2, GLUCOSE, BUN, CREATININE, CALCIUM, PROT, ALBUMIN, AST, ALT, ALKPHOS, BILITOT, GFRNONAA, GFRAA No results found for: CHOL, HDL, LDLCALC, LDLDIRECT, TRIG, CHOLHDL No results found for: [id number] No results found for: VITAMINB12 No results found for: TSH   12/30/14 MRI brain and orbits (with and without) [I reviewed images myself and agree with interpretation. -VRP]  1. Diffuse enhancement and increased T2 signal within the left optic nerve. This most likely represents optic neuritis. Differential diagnosis would include idiopathic para neuritis. Infectious neuritis is also considered. No definite mass lesion is present. There is no significant proptosis. 2. Normal MRI appearance of the brain. 3. Mild sinus disease.     ASSESSMENT AND PLAN  24 y.o. year old female here with new onset blurred vision and left eye pain consistent with left optic neuritis. 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 Will check additional testing to see if this may represent clinically isolated syndrome as part of multiple sclerosis. Will check other testing to rule out mimicking conditions.  Ddx: Isolated/idiopathic optic neuritis, CIS, NMO, autoimmune, inflammatory, para-infectious etiologies  PLAN: - additional workup  Orders Placed This Encounter  Procedures  . MR Cervical Spine W Wo Contrast  . MR Thoracic Spine W Wo Contrast  . Neuromyelitis Optica AQP4 Auto Ab  . VITAMIN D 25 Hydroxy (Vit-D Deficiency, Fractures)  . Vitamin B12  . ANA w/Reflex if Positive  . Pan-ANCA  . Sjogren's syndrome antibods(ssa + ssb)  . B. burgdorfi antibodies  . HIV antibody (with reflex)   Return in about 1 month (around 02/18/2015).    Suanne MarkerVIKRAM R. PENUMALLI, MD 01/19/2015, 11:32 AM Certified in Neurology, Neurophysiology and Neuroimaging  [facility] [address], [address] [place], [place] [address] [phone]

## 2015-01-20 ENCOUNTER — Encounter: Payer: Self-pay | Admitting: Diagnostic Neuroimaging

## 2015-01-20 LAB — VITAMIN B12: VITAMIN B 12: 346 pg/mL (ref 211–946)

## 2015-01-20 LAB — SJOGREN'S SYNDROME ANTIBODS(SSA + SSB): ENA SSB (LA) Ab: <0.2 AI (ref 0.0–0.9)

## 2015-01-20 LAB — PAN-ANCA
ANCA Proteinase 3: <3.5 U/mL (ref 0.0–3.5)
C-ANCA: <1:20 {titer}
P-ANCA: <1:20 {titer}

## 2015-01-20 LAB — ANA W/REFLEX IF POSITIVE: Anti Nuclear Antibody(ANA): NEGATIVE

## 2015-01-20 LAB — VITAMIN D 25 HYDROXY (VIT D DEFICIENCY, FRACTURES): VIT D 25 HYDROXY: 14.8 ng/mL — AB (ref 30.0–100.0)

## 2015-01-20 LAB — HIV ANTIBODY (ROUTINE TESTING W REFLEX): HIV SCREEN 4TH GENERATION: NONREACTIVE

## 2015-01-20 LAB — NEUROMYELITIS OPTICA AUTOAB, IGG

## 2015-01-20 LAB — B. BURGDORFI ANTIBODIES

## 2015-02-03 ENCOUNTER — Ambulatory Visit (INDEPENDENT_AMBULATORY_CARE_PROVIDER_SITE_OTHER): Payer: BLUE CROSS/BLUE SHIELD

## 2015-02-03 DIAGNOSIS — H538 Other visual disturbances: Secondary | ICD-10-CM

## 2015-02-03 DIAGNOSIS — H469 Unspecified optic neuritis: Secondary | ICD-10-CM

## 2015-02-03 MED ORDER — GADOPENTETATE DIMEGLUMINE 469.01 MG/ML IV SOLN: 20.0000 mL | Freq: Once | INTRAVENOUS | Status: AC | PRN

## 2015-02-24 ENCOUNTER — Ambulatory Visit: Payer: BLUE CROSS/BLUE SHIELD | Admitting: Diagnostic Neuroimaging

## 2015-03-10 ENCOUNTER — Ambulatory Visit: Payer: BLUE CROSS/BLUE SHIELD | Admitting: Diagnostic Neuroimaging

## 2015-03-11 ENCOUNTER — Encounter: Payer: Self-pay | Admitting: Diagnostic Neuroimaging

## 2016-01-02 ENCOUNTER — Emergency Department (HOSPITAL_COMMUNITY)
Admission: EM | Admit: 2016-01-02 | Discharge: 2016-01-02 | Disposition: A | Discharge status: Home / Self Care | Payer: BLUE CROSS/BLUE SHIELD | Attending: Emergency Medicine | Admitting: Emergency Medicine

## 2016-01-02 ENCOUNTER — Encounter (HOSPITAL_COMMUNITY): Payer: Self-pay

## 2016-01-02 ENCOUNTER — Emergency Department (HOSPITAL_COMMUNITY): Payer: BLUE CROSS/BLUE SHIELD

## 2016-01-02 DIAGNOSIS — S68120A Partial traumatic metacarpophalangeal amputation of right index finger, initial encounter: Secondary | ICD-10-CM | POA: Insufficient documentation

## 2016-01-02 DIAGNOSIS — S68129A Partial traumatic metacarpophalangeal amputation of unspecified finger, initial encounter: Secondary | ICD-10-CM

## 2016-01-02 DIAGNOSIS — Y939 Activity, unspecified: Secondary | ICD-10-CM | POA: Insufficient documentation

## 2016-01-02 DIAGNOSIS — Y990 Civilian activity done for income or pay: Secondary | ICD-10-CM | POA: Insufficient documentation

## 2016-01-02 DIAGNOSIS — Y9389 Activity, other specified: Secondary | ICD-10-CM | POA: Diagnosis not present

## 2016-01-02 DIAGNOSIS — Z23 Encounter for immunization: Secondary | ICD-10-CM | POA: Diagnosis not present

## 2016-01-02 DIAGNOSIS — Y929 Unspecified place or not applicable: Secondary | ICD-10-CM | POA: Diagnosis not present

## 2016-01-02 DIAGNOSIS — W260XXA Contact with knife, initial encounter: Secondary | ICD-10-CM | POA: Insufficient documentation

## 2016-01-02 DIAGNOSIS — S68119A Complete traumatic metacarpophalangeal amputation of unspecified finger, initial encounter: Secondary | ICD-10-CM

## 2016-01-02 DIAGNOSIS — S68122A Partial traumatic metacarpophalangeal amputation of right middle finger, initial encounter: Secondary | ICD-10-CM | POA: Diagnosis not present

## 2016-01-02 DIAGNOSIS — S61411A Laceration without foreign body of right hand, initial encounter: Secondary | ICD-10-CM | POA: Diagnosis present

## 2016-01-02 MED ORDER — IBUPROFEN 800 MG PO TABS
800.0000 mg | ORAL_TABLET | Freq: Once | ORAL | Status: AC
  Administered 2016-01-02: 800 mg via ORAL
  Filled 2016-01-02: qty 1

## 2016-01-02 MED ORDER — TETANUS-DIPHTH-ACELL PERTUSSIS 5-2.5-18.5 LF-MCG/0.5 IM SUSP
0.5000 mL | Freq: Once | INTRAMUSCULAR | Status: AC
  Administered 2016-01-02: 0.5 mL via INTRAMUSCULAR
  Filled 2016-01-02: qty 0.5

## 2016-01-02 NOTE — ED Provider Notes (Signed)
[facility]-EMERGENCY DEPT Provider Note   CSN: [id number] Arrival date & time: 01/02/16  1019     History   Chief Complaint Chief Complaint  Patient presents with  . Extremity Laceration    HPI Teresa Stokes is a 25 y.o. female.  The history is provided by the patient.  Laceration   The incident occurred less than 1 hour ago. The laceration is located on the right hand. Size: distal finger tip amputation of the right index and right middle fingers. The laceration mechanism was a a clean knife. The pain is severe. The pain has been constant since onset. She reports no foreign bodies present. Her tetanus status is unknown.    25 year old female who presents with right hand injury. Cutting lettuce at work when she accidentally cut the distal tips of the right index and middle finger. Right hand dominant. Unknown last tetanus. Unable to stop bleeding since injury. No other injuries reported. Has not taken medications prior to arrival.  Past Medical History:  Diagnosis Date  . Vision abnormalities     Patient Active Problem List   Diagnosis Date Noted  . Optic neuritis 01/19/2015    Past Surgical History:  Procedure Laterality Date  . ANTERIOR CRUCIATE LIGAMENT REPAIR Right     OB History    Gravida Para Term Preterm AB Living   0 0 0 0 0     SAB TAB Ectopic Multiple Live Births   0 0 0           Home Medications    Prior to Admission medications   Medication Sig Start Date End Date Taking? Authorizing Provider  predniSONE (DELTASONE) 10 MG tablet Take 6 tablets daily for 5 days, followed by 4 tablets for 2 days, followed by 3 tablets for 2 days, followed by 2 tablets for 2 days, followed by 1 tablet for 2 days for a total of 50 tablets over 13 days Patient not taking: Reported on 01/02/2016 12/30/14   Lyndal Pulleyaniel Knott, MD    Family History Family History  Problem Relation Age of Onset  . Healthy Mother   . Healthy Father   . Healthy Sister     Social History Social  History  Substance Use Topics  . Smoking status: Never Smoker  . Smokeless tobacco: Never Used  . Alcohol use 0.0 oz/week     Comment: social/fim     Allergies   Patient has no known allergies.   Review of Systems Review of Systems  Constitutional: Negative for fever.  Skin: Positive for wound.  Allergic/Immunologic: Negative for immunocompromised state.  Hematological: Does not bruise/bleed easily.  All other systems reviewed and are negative.    Physical Exam Updated Vital Signs BP (!) 145/104 (BP Location: Left Arm)   Pulse 71   Temp 98.6 F (37 C) (Oral)   Resp 16   SpO2 100%   Physical Exam Physical Exam  Nursing note and vitals reviewed. Constitutional: Well developed, well nourished, non-toxic, mildly anxious and tearful Head: Normocephalic and atraumatic.  Mouth/Throat: Oropharynx is clear and moist.  Neck: Normal range of motion.  Cardiovascular: +2 radial pulse of the RUE Pulmonary/Chest: Effort normal. Abdominal: Soft. There is no tenderness. There is no rebound and no guarding.  Musculoskeletal: distal tip of the right middle and index finger amputated w/o exposed bone, but involving the distal fingernail. Neurological: Alert, no facial droop, fluent speech, intact innervation involving the radial, ulnar and median nerves of the right hand Skin: Skin is  warm and dry.  Psychiatric: Cooperative   ED Treatments / Results  Labs (all labs ordered are listed, but only abnormal results are displayed) Labs Reviewed - No data to display  EKG  EKG Interpretation None       Radiology Dg Hand Complete Right  Result Date: 01/02/2016 CLINICAL DATA:  Distal finger tip amputation right index finger and right middle finger today. EXAM: RIGHT HAND - COMPLETE 3+ VIEW COMPARISON:  None. FINDINGS: Three views study shows subtle soft tissue defects at the tip of the index and middle fingers. No underlying bony abnormality. No evidence for retained radiopaque soft  tissue foreign body. IMPRESSION: No evidence for underlying fracture or retained radiopaque soft tissue foreign body. Electronically Signed   By: Kennith CenterEric  Mansell M.D.   On: 01/02/2016 12:37    Procedures Procedures (including critical care time)  Medications Ordered in ED Medications  Tdap (BOOSTRIX) injection 0.5 mL (0.5 mLs Intramuscular Given 01/02/16 1048)  ibuprofen (ADVIL,MOTRIN) tablet 800 mg (800 mg Oral Given 01/02/16 1049)     Initial Impression / Assessment and Plan / ED Course  I have reviewed the triage vital signs and the nursing notes.  Pertinent labs & imaging results that were available during my care of the patient were reviewed by me and considered in my medical decision making (see chart for details).  Clinical Course     Presenting with distal tip amputation of right middle and index finger. Bleeding difficult to control initially, but controlled w/ surgifoam and gauze dressing. Xr w/o bone involvement and no visualization of bone on exam. Tdap updated. Patient to follow-up with hand.  Strict return and follow-up instructions reviewed. She expressed understanding of all discharge instructions and felt comfortable with the plan of care.   Final Clinical Impressions(s) / ED Diagnoses   Final diagnoses:  Fingertip amputation, initial encounter    New Prescriptions New Prescriptions   No medications on file     Lavera Guiseana Duo Quinlyn Tep, MD 01/02/16 1335

## 2016-01-02 NOTE — Discharge Instructions (Signed)
Keep dressing on until follow-up with hand.  Keep dressing dry and keep covered with plastic bag while in shower.  Take ibuprofen and tylenol for pain control  Return for worsening symptoms, including fever, escalating pain or any other symptoms concerning to you.

## 2016-01-02 NOTE — ED Triage Notes (Signed)
Per pt, cut rt index and middle finger with slicer at work.  Side tip sheared off.  Bleeding controlled only with pressure.

## 2019-12-10 ENCOUNTER — Other Ambulatory Visit: Payer: Self-pay | Admitting: Family Medicine

## 2019-12-10 ENCOUNTER — Other Ambulatory Visit (HOSPITAL_COMMUNITY)
Admission: RE | Admit: 2019-12-10 | Discharge: 2019-12-10 | Disposition: A | Discharge status: Home / Self Care | Payer: BC Managed Care – PPO | Source: Ambulatory Visit | Attending: Family Medicine | Admitting: Family Medicine

## 2019-12-10 DIAGNOSIS — Z124 Encounter for screening for malignant neoplasm of cervix: Secondary | ICD-10-CM | POA: Insufficient documentation

## 2019-12-11 ENCOUNTER — Other Ambulatory Visit: Payer: Self-pay | Admitting: Family Medicine

## 2019-12-11 DIAGNOSIS — N910 Primary amenorrhea: Secondary | ICD-10-CM

## 2019-12-15 LAB — CYTOLOGY - PAP
Comment: NEGATIVE
Diagnosis: NEGATIVE
Diagnosis: REACTIVE
High risk HPV: NEGATIVE

## 2019-12-18 ENCOUNTER — Ambulatory Visit
Admission: RE | Admit: 2019-12-18 | Discharge: 2019-12-18 | Disposition: A | Discharge status: Home / Self Care | Payer: BLUE CROSS/BLUE SHIELD | Source: Ambulatory Visit | Attending: Family Medicine | Admitting: Family Medicine

## 2019-12-18 DIAGNOSIS — N910 Primary amenorrhea: Secondary | ICD-10-CM

## 2021-01-30 IMAGING — US US PELVIS COMPLETE WITH TRANSVAGINAL
1 series · 13 of 25 positions shown · non-contrast
Comparison: None
COMPARISON: None

Addendum:
CLINICAL DATA: Primary amenorrhea, patient was told she "does not
have ovaries"



[Series 1: us pelvis complete with transvaginal · 0.25mm/px · 42 acquisitions, 13 frames shown]
[im 1/42]
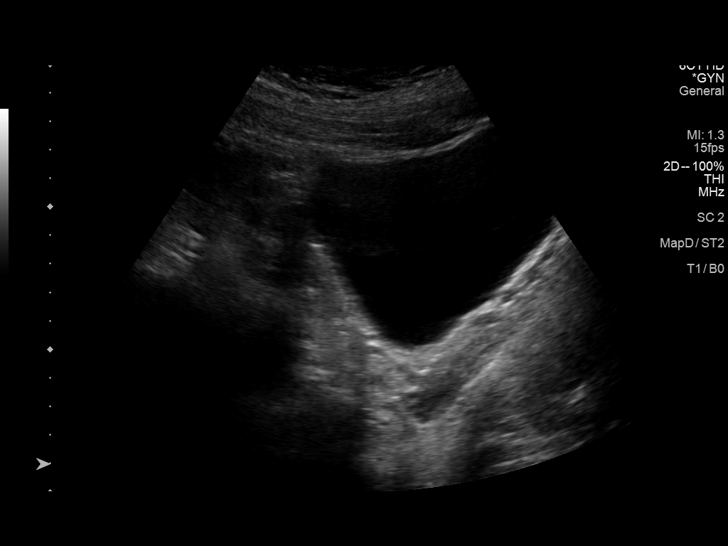
[im 4/42]
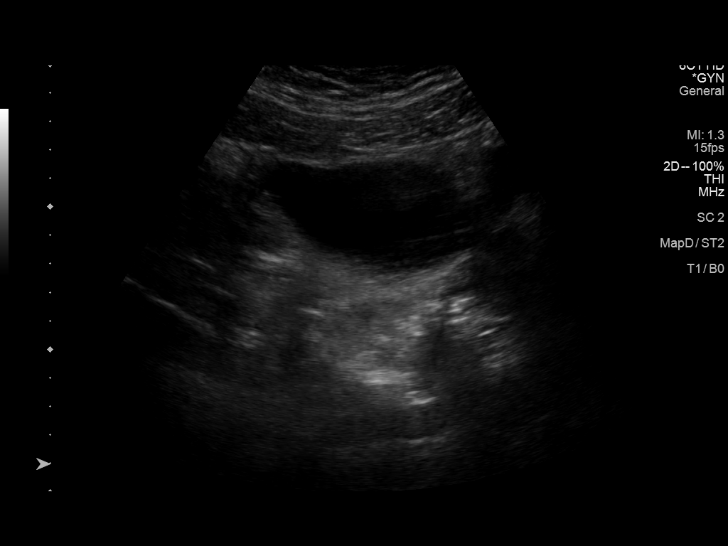
[im 7/42]
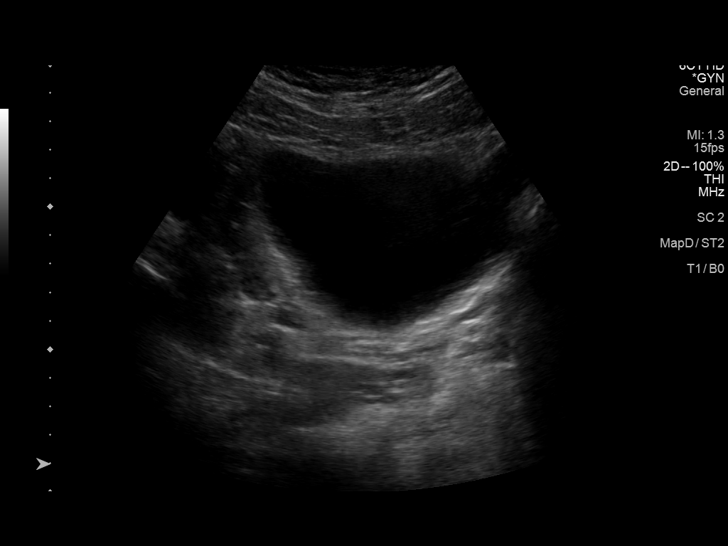
[im 11/42]
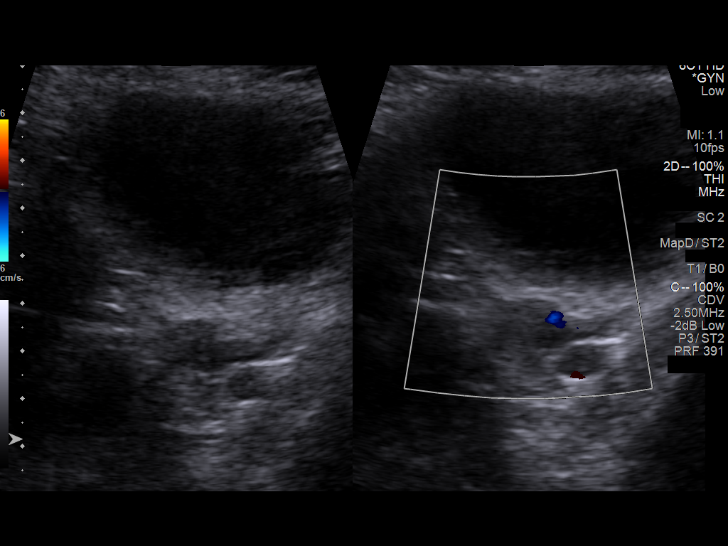
[im 14/42]
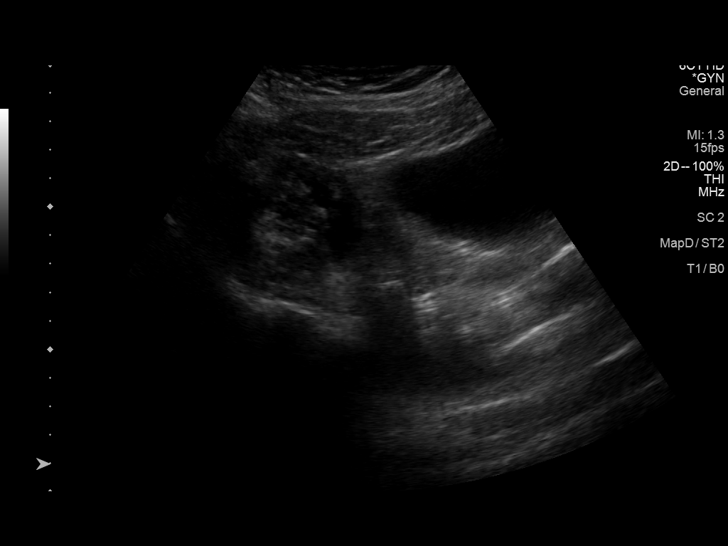
[im 18/42]
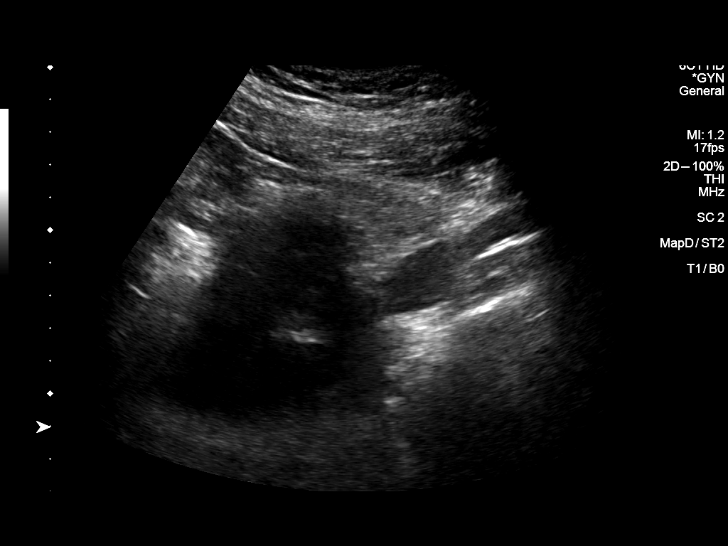
[im 21/42]
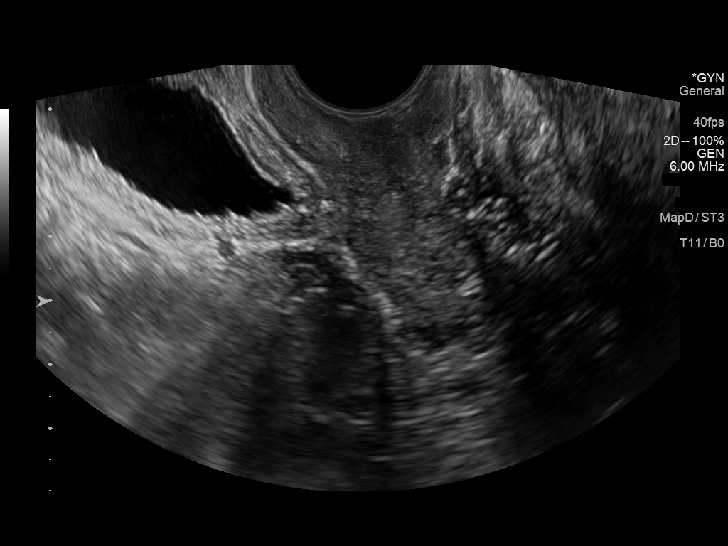
[im 24/42]
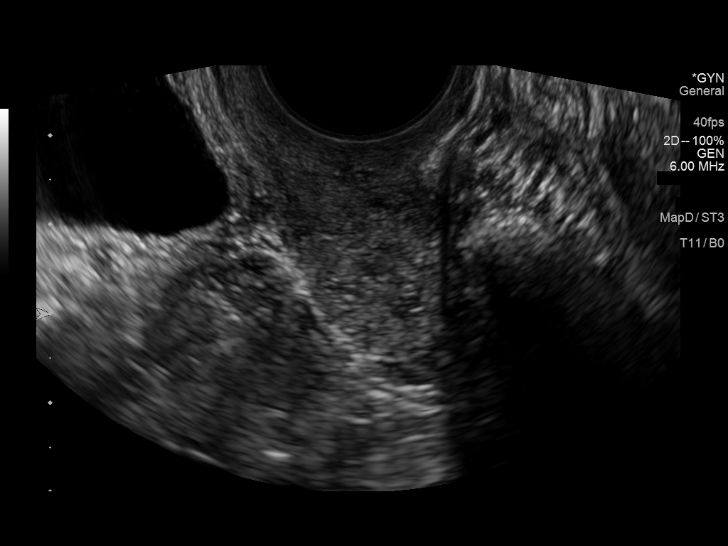
[im 28/42]
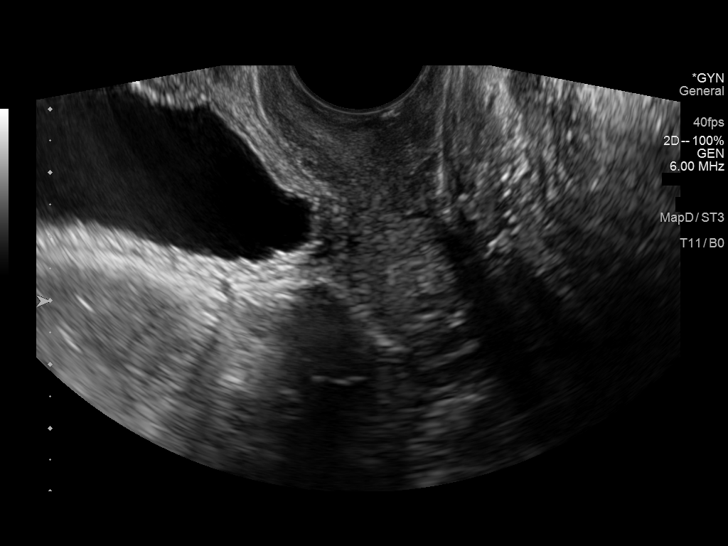
[im 31/42]
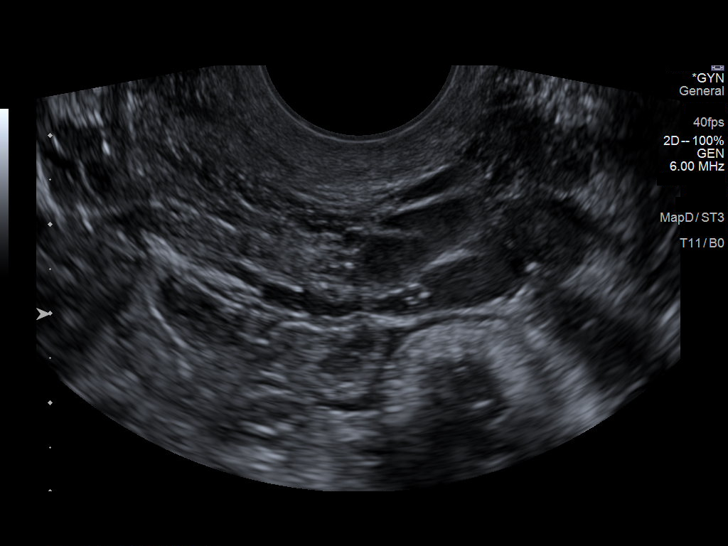
[im 35/42]
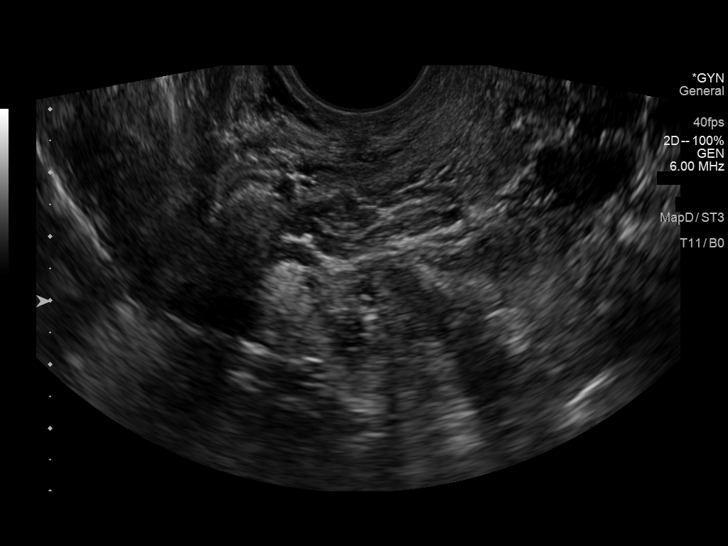
[im 38/42]
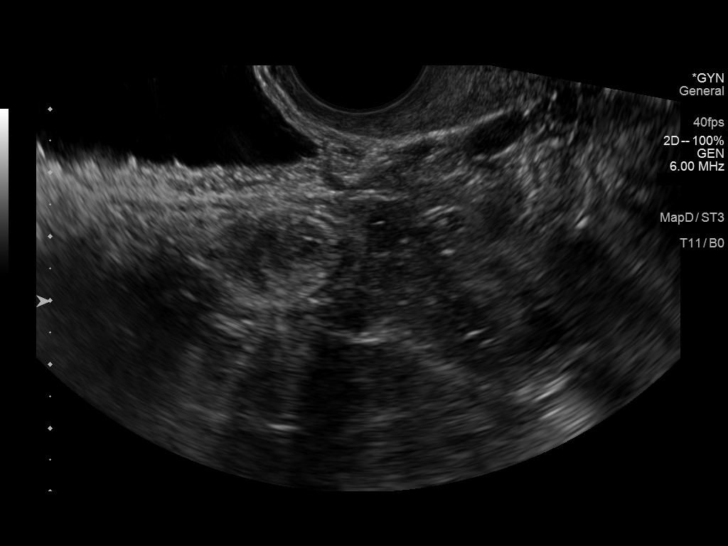
[im 42/42]
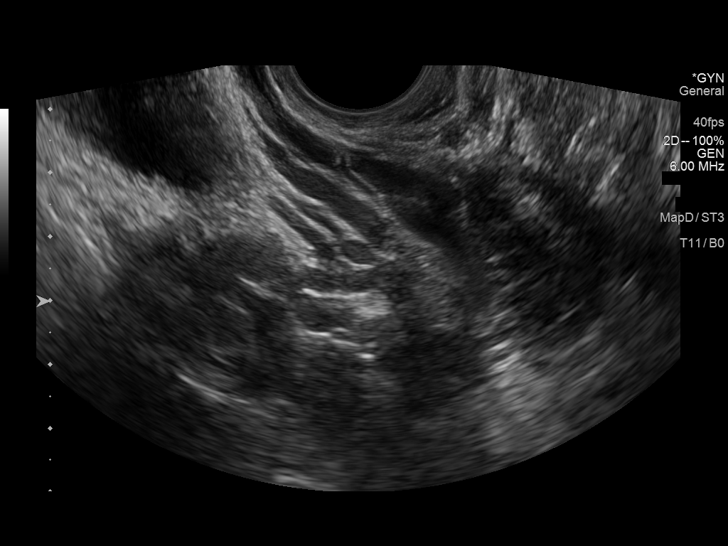

[13 of 25 positions shown; findings below may reference images not displayed]

FINDINGS: Uterus

Measurements: 3.8 x 1.9 x 2.7 cm = volume: 10 mL. Small atrophic
appearing uterus, retroverted. No obvious mass.

Endometrium

Not visualized

Right ovary

Not visualized, likely obscured by bowel

Left ovary

Measurements: Questionably visualized 1.7 x 1.3 x 1.5 cm = volume:
1.7 mL. No focal mass.

Other findings

No free pelvic fluid.  No adnexal masses.
IMPRESSION: Atrophic appearing uterus with nonvisualization of endometrial
complex and RIGHT ovary.

Questionable visualization of a normal appearing LEFT ovary.

MR [DATE] be of benefit in confirming presence of /
appearance of ovaries, as well as assessing uterus and endometrial
complex.

ADDENDUM:
Discussed with Dr. Alain Daniel.

Additional history: Patient does not have a uterus or cervix by
physical exam.

A soft tissue structure is identified at the vaginal cuff as
previously discussed, which I felt initially was likely an atrophic
uterus.

In the absence of the uterus on physical exam however, this could be
an atrophic or hypoplastic uterus, abnormal developmental tissue
related to failed uterine differentiation, or a soft tissue mass of
uncertain origin.

This mass measures 3.8 x 1.9 x 2.7 cm.

Recommend MR assessment of the pelvis to determine if this is a soft
tissue mass or an atrophic/hypoplastic uterus; this will also allow
for assessment for presence of potential ovaries.

*** End of Addendum ***
FINDINGS: Uterus

Measurements: 3.8 x 1.9 x 2.7 cm = volume: 10 mL. Small atrophic
appearing uterus, retroverted. No obvious mass.

Endometrium

Not visualized

Right ovary

Not visualized, likely obscured by bowel

Left ovary

Measurements: Questionably visualized 1.7 x 1.3 x 1.5 cm = volume:
1.7 mL. No focal mass.

Other findings

No free pelvic fluid.  No adnexal masses.
IMPRESSION: Atrophic appearing uterus with nonvisualization of endometrial
complex and RIGHT ovary.

Questionable visualization of a normal appearing LEFT ovary.

MR [DATE] be of benefit in confirming presence of /
appearance of ovaries, as well as assessing uterus and endometrial
complex.

## 2021-10-11 ENCOUNTER — Other Ambulatory Visit (HOSPITAL_COMMUNITY): Payer: Self-pay

## 2021-10-11 MED ORDER — WEGOVY 1.7 MG/0.75ML ~~LOC~~ SOAJ
SUBCUTANEOUS | 1 refills | Status: AC
  Filled 2021-10-11: qty 3, 28d supply, fill #0

## 2021-10-12 ENCOUNTER — Other Ambulatory Visit (HOSPITAL_COMMUNITY): Payer: Self-pay

## 2021-10-13 ENCOUNTER — Other Ambulatory Visit (HOSPITAL_COMMUNITY): Payer: Self-pay

## 2021-10-17 ENCOUNTER — Other Ambulatory Visit: Payer: Self-pay | Admitting: Gastroenterology

## 2021-10-17 ENCOUNTER — Other Ambulatory Visit (HOSPITAL_COMMUNITY): Payer: Self-pay | Admitting: Gastroenterology

## 2021-10-17 DIAGNOSIS — R110 Nausea: Secondary | ICD-10-CM

## 2021-10-17 DIAGNOSIS — K5900 Constipation, unspecified: Secondary | ICD-10-CM

## 2021-10-17 DIAGNOSIS — R6881 Early satiety: Secondary | ICD-10-CM

## 2021-11-04 ENCOUNTER — Ambulatory Visit (HOSPITAL_COMMUNITY): Payer: BC Managed Care – PPO

## 2021-11-04 ENCOUNTER — Encounter (HOSPITAL_COMMUNITY): Payer: Self-pay

## 2021-11-08 ENCOUNTER — Other Ambulatory Visit (HOSPITAL_COMMUNITY): Payer: Self-pay

## 2021-11-08 MED ORDER — POLYETHYLENE GLYCOL 3350 POWD: 1 refills | Status: AC

## 2021-11-08 MED ORDER — ALIGN 4 MG PO CAPS
4.0000 mg | ORAL_CAPSULE | Freq: Every day | ORAL | 1 refills | Status: AC
  Filled 2021-11-08: qty 28, 28d supply, fill #0

## 2021-11-08 MED ORDER — WEGOVY 1.7 MG/0.75ML ~~LOC~~ SOAJ
0.7500 mL | SUBCUTANEOUS | 1 refills | Status: AC
  Filled 2021-11-08: qty 3, 28d supply, fill #0

## 2021-11-08 MED ORDER — ONDANSETRON HCL 4 MG PO TABS
4.0000 mg | ORAL_TABLET | Freq: Four times a day (QID) | ORAL | 0 refills | Status: AC
  Filled 2021-11-08: qty 18, 21d supply, fill #0

## 2021-11-09 ENCOUNTER — Other Ambulatory Visit (HOSPITAL_COMMUNITY): Payer: Self-pay

## 2021-12-06 ENCOUNTER — Other Ambulatory Visit (HOSPITAL_COMMUNITY): Payer: Self-pay

## 2021-12-06 MED ORDER — WEGOVY 2.4 MG/0.75ML ~~LOC~~ SOAJ
2.4000 mg | SUBCUTANEOUS | 1 refills | Status: AC
  Filled 2021-12-06: qty 3, 28d supply, fill #0
  Filled 2022-03-28: qty 3, 28d supply, fill #1

## 2021-12-08 ENCOUNTER — Other Ambulatory Visit (HOSPITAL_COMMUNITY): Payer: Self-pay

## 2022-01-03 ENCOUNTER — Other Ambulatory Visit (HOSPITAL_COMMUNITY): Payer: Self-pay

## 2022-01-03 MED ORDER — WEGOVY 2.4 MG/0.75ML ~~LOC~~ SOAJ
2.4000 mg | SUBCUTANEOUS | 1 refills | Status: AC
  Filled 2022-01-03: qty 3, 28d supply, fill #0
  Filled 2022-01-26: qty 3, 28d supply, fill #1

## 2022-01-16 ENCOUNTER — Other Ambulatory Visit (HOSPITAL_COMMUNITY): Payer: Self-pay

## 2022-01-16 MED ORDER — ERGOCALCIFEROL 1.25 MG (50000 UT) PO CAPS
50000.0000 [IU] | ORAL_CAPSULE | ORAL | 2 refills | Status: AC
  Filled 2022-01-16: qty 4, 28d supply, fill #0

## 2022-01-26 ENCOUNTER — Other Ambulatory Visit (HOSPITAL_COMMUNITY): Payer: Self-pay

## 2022-01-27 ENCOUNTER — Other Ambulatory Visit (HOSPITAL_COMMUNITY): Payer: Self-pay

## 2022-02-17 ENCOUNTER — Other Ambulatory Visit: Payer: Self-pay

## 2022-02-17 ENCOUNTER — Emergency Department
Admission: EM | Admit: 2022-02-17 | Discharge: 2022-02-17 | Disposition: A | Discharge status: Home / Self Care | Payer: BC Managed Care – PPO | Attending: Emergency Medicine | Admitting: Emergency Medicine

## 2022-02-17 DIAGNOSIS — R059 Cough, unspecified: Secondary | ICD-10-CM | POA: Diagnosis present

## 2022-02-17 DIAGNOSIS — Z20822 Contact with and (suspected) exposure to covid-19: Secondary | ICD-10-CM | POA: Insufficient documentation

## 2022-02-17 DIAGNOSIS — J069 Acute upper respiratory infection, unspecified: Secondary | ICD-10-CM | POA: Insufficient documentation

## 2022-02-17 DIAGNOSIS — J22 Unspecified acute lower respiratory infection: Secondary | ICD-10-CM

## 2022-02-17 LAB — RESP PANEL BY RT-PCR (RSV, FLU A&B, COVID)  RVPGX2
Influenza A by PCR: NEGATIVE
Influenza B by PCR: NEGATIVE
Resp Syncytial Virus by PCR: NEGATIVE
SARS Coronavirus 2 by RT PCR: NEGATIVE

## 2022-02-17 MED ORDER — ALBUTEROL SULFATE HFA 108 (90 BASE) MCG/ACT IN AERS: 2.0000 | INHALATION_SPRAY | Freq: Four times a day (QID) | RESPIRATORY_TRACT | 2 refills | Status: AC | PRN

## 2022-02-17 MED ORDER — AMOXICILLIN 500 MG PO CAPS: 1000.0000 mg | ORAL_CAPSULE | Freq: Three times a day (TID) | ORAL | 0 refills | Status: AC

## 2022-02-17 MED ORDER — BENZONATATE 100 MG PO CAPS: 100.0000 mg | ORAL_CAPSULE | Freq: Three times a day (TID) | ORAL | 0 refills | Status: AC | PRN

## 2022-02-17 MED ORDER — PREDNISONE 50 MG PO TABS: 50.0000 mg | ORAL_TABLET | Freq: Every day | ORAL | 0 refills | Status: AC

## 2022-02-17 NOTE — ED Provider Notes (Signed)
[facility] Provider Note    Event Date/Time   First MD Initiated Contact with Patient 02/17/22 1607     (approximate)   History   Chief Complaint Cough   HPI Teresa Stokes is a 31 y.o. female, no significant medical history, presents to the emergency department for evaluation of flulike symptoms.  She reports cough and congestion x 2 days.  She endorses some difficulty breathing and feels like she has a lot of congestion in her chest.  Denies any other symptoms at this time.  History Limitations: No limitations.        Physical Exam  Triage Vital Signs: ED Triage Vitals  Enc Vitals Group     BP 02/17/22 1515 (!) 139/97     Pulse Rate 02/17/22 1515 90     Resp 02/17/22 1515 17     Temp 02/17/22 1515 98.9 F (37.2 C)     Temp Source 02/17/22 1515 Oral     SpO2 02/17/22 1515 98 %     Weight --      Height --      Head Circumference --      Peak Flow --      Pain Score 02/17/22 1508 4     Pain Loc --      Pain Edu? --      Excl. in GC? --     Most recent vital signs: Vitals:   02/17/22 1515  BP: (!) 139/97  Pulse: 90  Resp: 17  Temp: 98.9 F (37.2 C)  SpO2: 98%    General: Awake, NAD.  Skin: Warm, dry. No rashes or lesions.  Eyes: PERRL. Conjunctivae normal.  CV: Good peripheral perfusion.  Resp: Normal effort.  Moderate rhonchi and mild wheezing on exam. Abd: Soft, non-tender. No distention.  Neuro: At baseline. No gross neurological deficits.  Musculoskeletal: Normal ROM of all extremities.  Physical Exam    ED Results / Procedures / Treatments  Labs (all labs ordered are listed, but only abnormal results are displayed) Labs Reviewed  RESP PANEL BY RT-PCR (RSV, FLU A&B, COVID)  [id number]     EKG N/A.    RADIOLOGY  ED Provider Interpretation: N/A.  No results found.  PROCEDURES:  Critical Care performed: N/A.  Procedures    MEDICATIONS ORDERED IN ED: Medications - No data to display   IMPRESSION /  MDM / ASSESSMENT AND PLAN / ED COURSE  I reviewed the triage vital signs and the nursing notes.                              Differential diagnosis includes, but is not limited to, COVID-19, influenza, RSV, bronchitis, community-acquired pneumonia, viral URI, asthma.  Assessment/Plan Patient presents with flulike symptoms x 2 days.  On exam she does have moderate rhonchi and wheezing in the apices and bases bilaterally.  She appears well clinically and has normal O2 saturation on room air.  Her respiratory panel is negative for COVID-19 and influenza.  I suspect likely bronchitis, however will provide her with a brief prescription for amoxicillin to cover for possible early developing pneumonia.  Will provide her with prednisone, visitation albuterol as well.  Recommend that she follow-up with her primary care provider as needed.  She was amenable to this plan.  Will discharge.  Provided the patient with anticipatory guidance, return precautions, and educational material. Encouraged the patient to return to the emergency department at any  time if they begin to experience any new or worsening symptoms. Patient expressed understanding and agreed with the plan.   Patient's presentation is most consistent with acute complicated illness / injury requiring diagnostic workup.       FINAL CLINICAL IMPRESSION(S) / ED DIAGNOSES   Final diagnoses:  Lower respiratory infection     Rx / DC Orders   ED Discharge Orders          Ordered    amoxicillin (AMOXIL) 500 MG capsule  3 times daily        02/17/22 1610    albuterol (VENTOLIN HFA) 108 (90 Base) MCG/ACT inhaler  Every 6 hours PRN        02/17/22 1610    predniSONE (DELTASONE) 50 MG tablet  Daily with breakfast        02/17/22 1610    benzonatate (TESSALON PERLES) 100 MG capsule  3 times daily PRN        02/17/22 1610             Note:  This document was prepared using Dragon voice recognition software and may include unintentional  dictation errors.   Varney Daily, [place] 02/17/22 Babette Relic    Sharyn Creamer, MD 02/18/22 [phone]

## 2022-02-17 NOTE — ED Triage Notes (Signed)
Pt comes with c/o cough and congestion.

## 2022-02-17 NOTE — Discharge Instructions (Addendum)
-  You likely have a lower respiratory infection.  Is most likely viral, however given your physical exam findings, we will treat you for possible early pneumonia as well.  Please take the full course of the amoxicillin as prescribed.  Please take the full course of the prednisone as well.  You may utilize the albuterol inhaler as needed for shortness of breath.  You may take the benzonatate as needed for cough.  -Follow-up with your primary care provider as needed.  -Return to the emergency department anytime if you begin to experience any new or worsening symptoms.

## 2022-02-28 ENCOUNTER — Other Ambulatory Visit (HOSPITAL_COMMUNITY): Payer: Self-pay

## 2022-02-28 MED ORDER — WEGOVY 2.4 MG/0.75ML ~~LOC~~ SOAJ
2.4000 mg | SUBCUTANEOUS | 1 refills | Status: AC
  Filled 2022-02-28: qty 3, 28d supply, fill #0

## 2022-02-28 MED ORDER — ERGOCALCIFEROL 1.25 MG (50000 UT) PO CAPS
50000.0000 [IU] | ORAL_CAPSULE | ORAL | 2 refills | Status: AC
  Filled 2022-02-28: qty 4, 28d supply, fill #0
  Filled 2022-04-14: qty 4, 28d supply, fill #1
  Filled 2022-06-14 – 2022-07-26 (×2): qty 4, 28d supply, fill #2

## 2022-03-28 ENCOUNTER — Other Ambulatory Visit (HOSPITAL_COMMUNITY): Payer: Self-pay

## 2022-03-29 ENCOUNTER — Other Ambulatory Visit (HOSPITAL_COMMUNITY): Payer: Self-pay

## 2022-03-29 MED ORDER — WEGOVY 2.4 MG/0.75ML ~~LOC~~ SOAJ
2.4000 mg | SUBCUTANEOUS | 1 refills | Status: AC
  Filled 2022-06-02: qty 3, 28d supply, fill #0

## 2022-03-29 MED ORDER — ERGOCALCIFEROL 1.25 MG (50000 UT) PO CAPS
50000.0000 [IU] | ORAL_CAPSULE | ORAL | 2 refills | Status: AC
  Filled 2022-03-29: qty 4, 28d supply, fill #0

## 2022-04-05 ENCOUNTER — Other Ambulatory Visit (HOSPITAL_COMMUNITY): Payer: Self-pay

## 2022-04-14 ENCOUNTER — Other Ambulatory Visit (HOSPITAL_COMMUNITY): Payer: Self-pay

## 2022-04-25 ENCOUNTER — Other Ambulatory Visit (HOSPITAL_COMMUNITY): Payer: Self-pay

## 2022-04-25 MED ORDER — LINZESS 72 MCG PO CAPS
72.0000 ug | ORAL_CAPSULE | Freq: Every day | ORAL | 0 refills | Status: AC
  Filled 2022-04-25: qty 90, 90d supply, fill #0

## 2022-04-25 MED ORDER — WEGOVY 2.4 MG/0.75ML ~~LOC~~ SOAJ
2.4000 mg | SUBCUTANEOUS | 1 refills | Status: AC
  Filled 2022-04-25: qty 3, 28d supply, fill #0

## 2022-04-27 ENCOUNTER — Other Ambulatory Visit (HOSPITAL_COMMUNITY): Payer: Self-pay

## 2022-06-02 ENCOUNTER — Other Ambulatory Visit (HOSPITAL_COMMUNITY): Payer: Self-pay

## 2022-06-05 ENCOUNTER — Other Ambulatory Visit (HOSPITAL_COMMUNITY): Payer: Self-pay

## 2022-06-06 ENCOUNTER — Other Ambulatory Visit (HOSPITAL_COMMUNITY): Payer: Self-pay

## 2022-06-12 ENCOUNTER — Other Ambulatory Visit (HOSPITAL_COMMUNITY): Payer: Self-pay

## 2022-06-12 MED ORDER — TOPIRAMATE 25 MG PO TABS
25.0000 mg | ORAL_TABLET | Freq: Every evening | ORAL | 0 refills | Status: DC
  Filled 2022-06-12: qty 30, 30d supply, fill #0

## 2022-06-12 MED ORDER — PHENTERMINE HCL 15 MG PO CAPS
15.0000 mg | ORAL_CAPSULE | Freq: Every morning | ORAL | 0 refills | Status: DC
  Filled 2022-06-12: qty 30, 30d supply, fill #0

## 2022-06-14 ENCOUNTER — Other Ambulatory Visit (HOSPITAL_COMMUNITY): Payer: Self-pay

## 2022-06-22 ENCOUNTER — Other Ambulatory Visit (HOSPITAL_COMMUNITY): Payer: Self-pay

## 2022-07-11 ENCOUNTER — Other Ambulatory Visit (HOSPITAL_COMMUNITY): Payer: Self-pay

## 2022-07-11 MED ORDER — PHENTERMINE HCL 15 MG PO CAPS
15.0000 mg | ORAL_CAPSULE | Freq: Every day | ORAL | 0 refills | Status: AC
  Filled 2022-07-11 – 2022-07-26 (×3): qty 30, 30d supply, fill #0

## 2022-07-11 MED ORDER — ERGOCALCIFEROL 1.25 MG (50000 UT) PO CAPS
1.0000 | ORAL_CAPSULE | ORAL | 2 refills | Status: AC
  Filled 2022-07-11: qty 4, 28d supply, fill #0

## 2022-07-11 MED ORDER — TOPIRAMATE 25 MG PO TABS
25.0000 mg | ORAL_TABLET | Freq: Every evening | ORAL | 0 refills | Status: AC
  Filled 2022-07-11 – 2022-07-26 (×2): qty 30, 30d supply, fill #0

## 2022-07-19 ENCOUNTER — Other Ambulatory Visit (HOSPITAL_COMMUNITY): Payer: Self-pay

## 2022-07-26 ENCOUNTER — Other Ambulatory Visit (HOSPITAL_COMMUNITY): Payer: Self-pay

## 2022-09-05 ENCOUNTER — Other Ambulatory Visit (HOSPITAL_COMMUNITY): Payer: Self-pay

## 2022-09-05 MED ORDER — ONDANSETRON HCL 4 MG PO TABS
4.0000 mg | ORAL_TABLET | Freq: Four times a day (QID) | ORAL | 0 refills | Status: AC | PRN
  Filled 2022-09-05: qty 18, 5d supply, fill #0

## 2022-11-14 ENCOUNTER — Other Ambulatory Visit (HOSPITAL_COMMUNITY): Payer: Self-pay

## 2022-11-14 MED ORDER — PHENTERMINE HCL 37.5 MG PO TABS
37.5000 mg | ORAL_TABLET | Freq: Every day | ORAL | 0 refills | Status: AC
  Filled 2022-11-14: qty 30, 30d supply, fill #0

## 2022-11-18 ENCOUNTER — Other Ambulatory Visit (HOSPITAL_COMMUNITY): Payer: Self-pay
